# Patient Record
Sex: Female | Born: 1975 | Race: White | Hispanic: No | Marital: Single | Smoking: Never smoker
Health system: Southern US, Community
[De-identification: ages and names within clinical notes are randomized; demographics above are authoritative.]

## PROBLEM LIST (undated history)

## (undated) DIAGNOSIS — O24419 Gestational diabetes mellitus in pregnancy, unspecified control: Secondary | ICD-10-CM

---

## 2014-08-04 ENCOUNTER — Emergency Department (HOSPITAL_COMMUNITY)
Admission: EM | Admit: 2014-08-04 | Discharge: 2014-08-04 | Disposition: A | Discharge status: Home / Self Care | Payer: Medicaid Other | Attending: Emergency Medicine | Admitting: Emergency Medicine

## 2014-08-04 ENCOUNTER — Emergency Department (HOSPITAL_COMMUNITY): Payer: Medicaid Other

## 2014-08-04 ENCOUNTER — Encounter (HOSPITAL_COMMUNITY): Payer: Self-pay | Admitting: Emergency Medicine

## 2014-08-04 DIAGNOSIS — Z23 Encounter for immunization: Secondary | ICD-10-CM | POA: Diagnosis not present

## 2014-08-04 DIAGNOSIS — S50311A Abrasion of right elbow, initial encounter: Secondary | ICD-10-CM | POA: Diagnosis not present

## 2014-08-04 DIAGNOSIS — T07 Unspecified multiple injuries: Secondary | ICD-10-CM | POA: Diagnosis not present

## 2014-08-04 DIAGNOSIS — Y92410 Unspecified street and highway as the place of occurrence of the external cause: Secondary | ICD-10-CM | POA: Insufficient documentation

## 2014-08-04 DIAGNOSIS — Y998 Other external cause status: Secondary | ICD-10-CM | POA: Diagnosis not present

## 2014-08-04 DIAGNOSIS — S0990XA Unspecified injury of head, initial encounter: Secondary | ICD-10-CM | POA: Diagnosis present

## 2014-08-04 DIAGNOSIS — T07XXXA Unspecified multiple injuries, initial encounter: Secondary | ICD-10-CM

## 2014-08-04 DIAGNOSIS — Y9389 Activity, other specified: Secondary | ICD-10-CM | POA: Insufficient documentation

## 2014-08-04 DIAGNOSIS — S0081XA Abrasion of other part of head, initial encounter: Secondary | ICD-10-CM | POA: Diagnosis not present

## 2014-08-04 MED ORDER — ACETAMINOPHEN 325 MG PO TABS
650.0000 mg | ORAL_TABLET | Freq: Once | ORAL | Status: AC
  Administered 2014-08-04: 650 mg via ORAL
  Filled 2014-08-04: qty 2

## 2014-08-04 MED ORDER — IBUPROFEN 800 MG PO TABS
800.0000 mg | ORAL_TABLET | Freq: Once | ORAL | Status: AC
  Administered 2014-08-04: 800 mg via ORAL
  Filled 2014-08-04: qty 1

## 2014-08-04 MED ORDER — TETANUS-DIPHTH-ACELL PERTUSSIS 5-2.5-18.5 LF-MCG/0.5 IM SUSP
0.5000 mL | Freq: Once | INTRAMUSCULAR | Status: AC
  Administered 2014-08-04: 0.5 mL via INTRAMUSCULAR
  Filled 2014-08-04: qty 0.5

## 2014-08-04 NOTE — ED Notes (Signed)
EDP at bedside  

## 2014-08-04 NOTE — ED Notes (Signed)
Pt. C/o head pain, requesting tylenol. EDP notified.
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
## 2014-08-06 ENCOUNTER — Emergency Department (HOSPITAL_COMMUNITY)
Admission: EM | Admit: 2014-08-06 | Discharge: 2014-08-06 | Disposition: A | Discharge status: Home / Self Care | Payer: Medicaid Other | Attending: Emergency Medicine | Admitting: Emergency Medicine

## 2014-08-06 ENCOUNTER — Encounter (HOSPITAL_COMMUNITY): Payer: Self-pay | Admitting: Emergency Medicine

## 2014-08-06 DIAGNOSIS — S0003XD Contusion of scalp, subsequent encounter: Secondary | ICD-10-CM | POA: Insufficient documentation

## 2014-08-06 DIAGNOSIS — S0990XD Unspecified injury of head, subsequent encounter: Secondary | ICD-10-CM | POA: Diagnosis present

## 2014-08-06 DIAGNOSIS — Z8632 Personal history of gestational diabetes: Secondary | ICD-10-CM | POA: Insufficient documentation

## 2014-08-06 HISTORY — DX: Gestational diabetes mellitus in pregnancy, unspecified control: O24.419
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
## 2014-08-06 NOTE — Discharge Instructions (Signed)
Apply ice to the area and take motrin as needed for pain. Return for worsening symptoms.
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
## 2023-02-03 ENCOUNTER — Encounter: Payer: Self-pay | Admitting: Internal Medicine

## 2024-06-08 ENCOUNTER — Encounter (INDEPENDENT_AMBULATORY_CARE_PROVIDER_SITE_OTHER): Payer: Self-pay | Admitting: *Deleted
